# Patient Record
Sex: Male | Born: 1985 | Race: Black or African American | Hispanic: No | Marital: Single | Smoking: Current every day smoker
Health system: Southern US, Community
[De-identification: ages and names within clinical notes are randomized; demographics above are authoritative.]

---

## 2008-07-10 ENCOUNTER — Emergency Department (HOSPITAL_COMMUNITY): Admission: EM | Admit: 2008-07-10 | Discharge: 2008-07-10 | Payer: Self-pay | Admitting: Emergency Medicine

## 2021-01-13 ENCOUNTER — Emergency Department (HOSPITAL_BASED_OUTPATIENT_CLINIC_OR_DEPARTMENT_OTHER): Payer: 59

## 2021-01-13 ENCOUNTER — Emergency Department (HOSPITAL_BASED_OUTPATIENT_CLINIC_OR_DEPARTMENT_OTHER)
Admission: EM | Admit: 2021-01-13 | Discharge: 2021-01-13 | Disposition: A | Discharge status: Home / Self Care | Payer: 59 | Attending: Emergency Medicine | Admitting: Emergency Medicine

## 2021-01-13 ENCOUNTER — Encounter (HOSPITAL_BASED_OUTPATIENT_CLINIC_OR_DEPARTMENT_OTHER): Payer: Self-pay | Admitting: Emergency Medicine

## 2021-01-13 DIAGNOSIS — R059 Cough, unspecified: Secondary | ICD-10-CM | POA: Insufficient documentation

## 2021-01-13 DIAGNOSIS — F17210 Nicotine dependence, cigarettes, uncomplicated: Secondary | ICD-10-CM | POA: Diagnosis not present

## 2021-01-13 DIAGNOSIS — R1110 Vomiting, unspecified: Secondary | ICD-10-CM | POA: Insufficient documentation

## 2021-01-13 DIAGNOSIS — Z20822 Contact with and (suspected) exposure to covid-19: Secondary | ICD-10-CM | POA: Insufficient documentation

## 2021-01-13 DIAGNOSIS — R519 Headache, unspecified: Secondary | ICD-10-CM | POA: Diagnosis not present

## 2021-01-13 DIAGNOSIS — R590 Localized enlarged lymph nodes: Secondary | ICD-10-CM | POA: Diagnosis present

## 2021-01-13 LAB — RESP PANEL BY RT-PCR (FLU A&B, COVID) ARPGX2
Influenza A by PCR: NEGATIVE
Influenza B by PCR: NEGATIVE
SARS Coronavirus 2 by RT PCR: NEGATIVE

## 2021-01-13 LAB — CBC WITH DIFFERENTIAL/PLATELET
Abs Immature Granulocytes: 0.01 10*3/uL (ref 0.00–0.07)
Basophils Absolute: 0 10*3/uL (ref 0.0–0.1)
Basophils Relative: 0 %
Eosinophils Absolute: 0 10*3/uL (ref 0.0–0.5)
Eosinophils Relative: 0 %
HCT: 42.5 % (ref 39.0–52.0)
Hemoglobin: 14.6 g/dL (ref 13.0–17.0)
Immature Granulocytes: 0 %
Lymphocytes Relative: 20 %
Lymphs Abs: 1.2 10*3/uL (ref 0.7–4.0)
MCH: 31.5 pg (ref 26.0–34.0)
MCHC: 34.4 g/dL (ref 30.0–36.0)
MCV: 91.6 fL (ref 80.0–100.0)
Monocytes Absolute: 0.6 10*3/uL (ref 0.1–1.0)
Monocytes Relative: 10 %
Neutro Abs: 4.2 10*3/uL (ref 1.7–7.7)
Neutrophils Relative %: 70 %
Platelets: 207 10*3/uL (ref 150–400)
RBC: 4.64 MIL/uL (ref 4.22–5.81)
RDW: 12.4 % (ref 11.5–15.5)
WBC: 6 10*3/uL (ref 4.0–10.5)
nRBC: 0 % (ref 0.0–0.2)

## 2021-01-13 LAB — COMPREHENSIVE METABOLIC PANEL
ALT: 32 U/L (ref 0–44)
AST: 39 U/L (ref 15–41)
Albumin: 4 g/dL (ref 3.5–5.0)
Alkaline Phosphatase: 72 U/L (ref 38–126)
Anion gap: 9 (ref 5–15)
BUN: 10 mg/dL (ref 6–20)
CO2: 24 mmol/L (ref 22–32)
Calcium: 8.8 mg/dL — ABNORMAL LOW (ref 8.9–10.3)
Chloride: 100 mmol/L (ref 98–111)
Creatinine, Ser: 1.01 mg/dL (ref 0.61–1.24)
GFR, Estimated: >60 mL/min (ref >60)
Glucose, Bld: 101 mg/dL — ABNORMAL HIGH (ref 70–99)
Potassium: 4 mmol/L (ref 3.5–5.1)
Sodium: 133 mmol/L — ABNORMAL LOW (ref 135–145)
Total Bilirubin: 0.8 mg/dL (ref 0.3–1.2)
Total Protein: 8 g/dL (ref 6.5–8.1)

## 2021-01-13 LAB — MONONUCLEOSIS SCREEN: Mono Screen: NEGATIVE

## 2021-01-13 MED ORDER — IOHEXOL 300 MG/ML  SOLN
75.0000 mL | Freq: Once | INTRAMUSCULAR | Status: AC | PRN
  Administered 2021-01-13: 75 mL via INTRAVENOUS

## 2021-01-13 MED ORDER — ACETAMINOPHEN 500 MG PO TABS
1000.0000 mg | ORAL_TABLET | Freq: Once | ORAL | Status: AC
  Administered 2021-01-13: 1000 mg via ORAL
  Filled 2021-01-13: qty 2

## 2021-01-13 MED ORDER — AMOXICILLIN-POT CLAVULANATE 875-125 MG PO TABS: 1.0000 | ORAL_TABLET | Freq: Two times a day (BID) | ORAL | 0 refills | Status: AC

## 2021-01-13 MED ORDER — AMOXICILLIN-POT CLAVULANATE 875-125 MG PO TABS
1.0000 | ORAL_TABLET | Freq: Once | ORAL | Status: AC
  Administered 2021-01-13: 1 via ORAL
  Filled 2021-01-13: qty 1

## 2021-01-13 MED ORDER — ONDANSETRON 4 MG PO TBDP
4.0000 mg | ORAL_TABLET | Freq: Once | ORAL | Status: AC
Start: 2021-01-13 — End: 2021-01-13
  Administered 2021-01-13: 4 mg via ORAL
  Filled 2021-01-13: qty 1

## 2021-01-13 MED ORDER — SODIUM CHLORIDE 0.9 % IV BOLUS
1000.0000 mL | Freq: Once | INTRAVENOUS | Status: AC
  Administered 2021-01-13: 1000 mL via INTRAVENOUS

## 2021-01-13 MED ORDER — ACETAMINOPHEN 325 MG PO TABS
650.0000 mg | ORAL_TABLET | Freq: Once | ORAL | Status: DC
  Filled 2021-01-13: qty 2

## 2021-01-13 MED ORDER — ONDANSETRON HCL 4 MG/2ML IJ SOLN
4.0000 mg | Freq: Once | INTRAMUSCULAR | Status: AC
  Administered 2021-01-13: 4 mg via INTRAVENOUS
  Filled 2021-01-13: qty 2

## 2021-01-13 NOTE — ED Triage Notes (Signed)
Pt reports lymph nodes to RT side neck swollen intermittently x 2wks; denies pain; reports vomiting since yesterday; HA x 1 wk

## 2021-01-13 NOTE — ED Provider Notes (Signed)
[facility] EMERGENCY DEPARTMENT Provider Note   CSN: [id number] Arrival date & time: 01/13/21  1407     History Chief Complaint  Patient presents with   Emesis   Swollen Lymph Nodes    Headache    Paul Obrien is a 35 y.o. male here with R neck swelling, vomiting. He noticed right neck swelling for the last week. Patient states that it got progressively worse. Denies any trouble breathing or trouble swallowing. He started having headaches as well. Had fever for the last 2-3 days. He states that since yesterday, he has been unable to keep anything down and has been vomiting. Denies any abdominal pain. Has nonproductive cough   The history is provided by the patient.      History reviewed. No pertinent past medical history.  There are no problems to display for this patient.   History reviewed. No pertinent surgical history.     No family history on file.  Social History   Tobacco Use   Smoking status: Every Day    Types: Cigarettes   Smokeless tobacco: Never  Substance Use Topics   Alcohol use: Not Currently   Drug use: Not Currently    Home Medications Prior to Admission medications   Not on File    Allergies    Patient has no known allergies.  Review of Systems   Review of Systems  HENT:         Neck swelling   Respiratory:  Positive for cough.   Gastrointestinal:  Positive for vomiting.  Neurological:  Positive for headaches.  All other systems reviewed and are negative.  Physical Exam Updated Vital Signs BP (!) 122/56 (BP Location: Right Arm)   Pulse 81   Temp 99.4 F (37.4 C) (Oral)   Resp 18   Ht 6\' 1"  (1.854 m)   Wt (!) 171.9 kg   SpO2 98%   BMI 50.00 kg/m   Physical Exam Vitals and nursing note reviewed.  Constitutional:      Comments: Slightly uncomfortable   HENT:     Head: Normocephalic.     Mouth/Throat:     Mouth: Mucous membranes are moist.     Comments: Posterior pharynx clear  Eyes:     Extraocular  Movements: Extraocular movements intact.     Pupils: Pupils are equal, round, and reactive to light.  Neck:     Comments: R side of the neck swollen with tender LAD  Cardiovascular:     Rate and Rhythm: Normal rate and regular rhythm.     Heart sounds: Normal heart sounds.  Pulmonary:     Effort: Pulmonary effort is normal.     Breath sounds: Normal breath sounds.  Abdominal:     General: Bowel sounds are normal.     Palpations: Abdomen is soft.  Musculoskeletal:        General: Normal range of motion.  Skin:    General: Skin is warm.  Neurological:     Mental Status: He is alert and oriented to person, place, and time.  Psychiatric:        Mood and Affect: Mood normal.        Behavior: Behavior normal.    ED Results / Procedures / Treatments   Labs (all labs ordered are listed, but only abnormal results are displayed) Labs Reviewed  COMPREHENSIVE METABOLIC PANEL - Abnormal; Notable for the following components:      Result Value   Sodium 133 (*)    Glucose,  Bld 101 (*)    Calcium 8.8 (*)    All other components within normal limits  RESP PANEL BY RT-PCR (FLU A&B, COVID) ARPGX2  CBC WITH DIFFERENTIAL/PLATELET  MONONUCLEOSIS SCREEN    EKG None  Radiology CT Soft Tissue Neck W Contrast  Result Date: 01/13/2021 CLINICAL DATA:  Neck abscess, deep tissue. Additional history provided: Patient reports lymph nodes in right side of neck swollen intermittently for 2 weeks, patient reports vomiting since yesterday, headache for 1 week. EXAM: CT NECK WITH CONTRAST TECHNIQUE: Multidetector CT imaging of the neck was performed using the standard protocol following the bolus administration of intravenous contrast. CONTRAST:  33mL OMNIPAQUE IOHEXOL 300 MG/ML  SOLN COMPARISON:  No pertinent prior exams available for comparison. FINDINGS: Pharynx and larynx: No appreciable swelling or discrete mass within the oral cavity, pharynx or larynx. The epiglottis is unremarkable. No  retropharyngeal collection. Salivary glands: No inflammation, mass, or stone. Thyroid: Unremarkable. Lymph nodes: There is somewhat prominent cervical lymphadenopathy at the right level 2, 3 and 5 stations. Index right level 2 lymph nodes measures up to 17 mm in short axis (series 4, image 83) (series 4, image 69). Fat stranding/edema surrounds these enlarged lymph nodes. Vascular: The major vascular structures of the neck are patent. The right internal jugular vein is significantly narrowed within the upper and mid right neck secondary to mass effect from adjacent lymphadenopathy. Limited intracranial: No evidence of acute intracranial abnormality within the field of view. Visualized orbits: Incompletely imaged. No orbital mass or acute orbital finding at the imaged levels. Mastoids and visualized paranasal sinuses: No significant paranasal sinus disease or mastoid effusion at the imaged levels. Skeleton: Straightening of the expected cervical lordosis. No acute bony abnormality or aggressive osseous lesion. Upper chest: No consolidation within the imaged lung apices. IMPRESSION: Somewhat prominent lymphadenopathy within the right neck at the level 2, 3 and 5 stations with surrounding fat stranding/edema. Index right level 2 lymph nodes measures 17 mm in short axis. This lymphadenopathy may be infectious/reactive in etiology. However, nodal metastatic disease or a lymphoproliferative process (such as lymphoma) are also considerations. A minimum of close clinical follow-up (with imaging follow-up as warranted) is recommended. If this lymphadenopathy persists, direct tissue sampling should be considered. Significant narrowing of the right internal jugular vein at the level of the upper/mid neck due to mass effect from adjacent lymphadenopathy. Electronically Signed   By: Jackey Loge D.O.   On: 01/13/2021 17:41   DG Chest Port 1 View  Result Date: 01/13/2021 CLINICAL DATA:  Cough, fever EXAM: PORTABLE CHEST 1  VIEW COMPARISON:  None. FINDINGS: The heart size and mediastinal contours are within normal limits. Both lungs are clear. The visualized skeletal structures are unremarkable. IMPRESSION: No active disease. Electronically Signed   By: Charlett Nose M.D.   On: 01/13/2021 16:20    Procedures Procedures   Medications Ordered in ED Medications  acetaminophen (TYLENOL) tablet 650 mg (0 mg Oral Hold 01/13/21 1633)  amoxicillin-clavulanate (AUGMENTIN) 875-125 MG per tablet 1 tablet (has no administration in time range)  ondansetron (ZOFRAN-ODT) disintegrating tablet 4 mg (4 mg Oral Given 01/13/21 1502)  acetaminophen (TYLENOL) tablet 1,000 mg (1,000 mg Oral Given 01/13/21 1522)  ondansetron (ZOFRAN) injection 4 mg (4 mg Intravenous Given 01/13/21 1620)  sodium chloride 0.9 % bolus 1,000 mL (1,000 mLs Intravenous New Bag/Given 01/13/21 1621)  iohexol (OMNIPAQUE) 300 MG/ML solution 75 mL (75 mLs Intravenous Contrast Given 01/13/21 1704)    ED Course  I have reviewed the  triage vital signs and the nursing notes.  Pertinent labs & imaging results that were available during my care of the patient were reviewed by me and considered in my medical decision making (see chart for details).    MDM Rules/Calculators/A&P                           Paul Obrien is a 35 y.o. male here with R neck swelling, vomiting. Consider sialoadenitis vs neck abscess vs mono vs other viral etiologies. Posterior pharynx clear with no signs of strep or RPA. Will get cbc, cmp, mono, CXR, CT neck. Will hydrate and reassess.    6:22 PM Mono negative. WBC normal.  Chemistry unremarkable.  CT showed enlarged lymph nodes on the right side.  There was concern for possible infectious etiology versus mass.  Patient's temperature is normal now.  At this point we will treat with a course of Augmentin.  Will refer to ENT for possible biopsy if it is persistently swollen  Final Clinical Impression(s) / ED Diagnoses Final diagnoses:   None    Rx / DC Orders ED Discharge Orders     None        Charlynne Pander, MD 01/13/21 [phone]

## 2021-01-13 NOTE — Discharge Instructions (Addendum)
You have a swollen lymph node on the right side.  This could be from infection or from a mass  Take Augmentin twice daily for a week.  If it still swollen in a week, please call ENT for follow-up.  You likely will need a biopsy to make sure you do not have cancer  Return to ER if you have worse neck swelling, trouble swallowing, trouble breathing

## 2023-04-18 IMAGING — DX DG CHEST 1V PORT
1 series · 1 of 1 positions shown · non-contrast
Comparison: None.

CLINICAL DATA: Cough, fever

EXAM:
PORTABLE CHEST 1 VIEW

[chest ap]
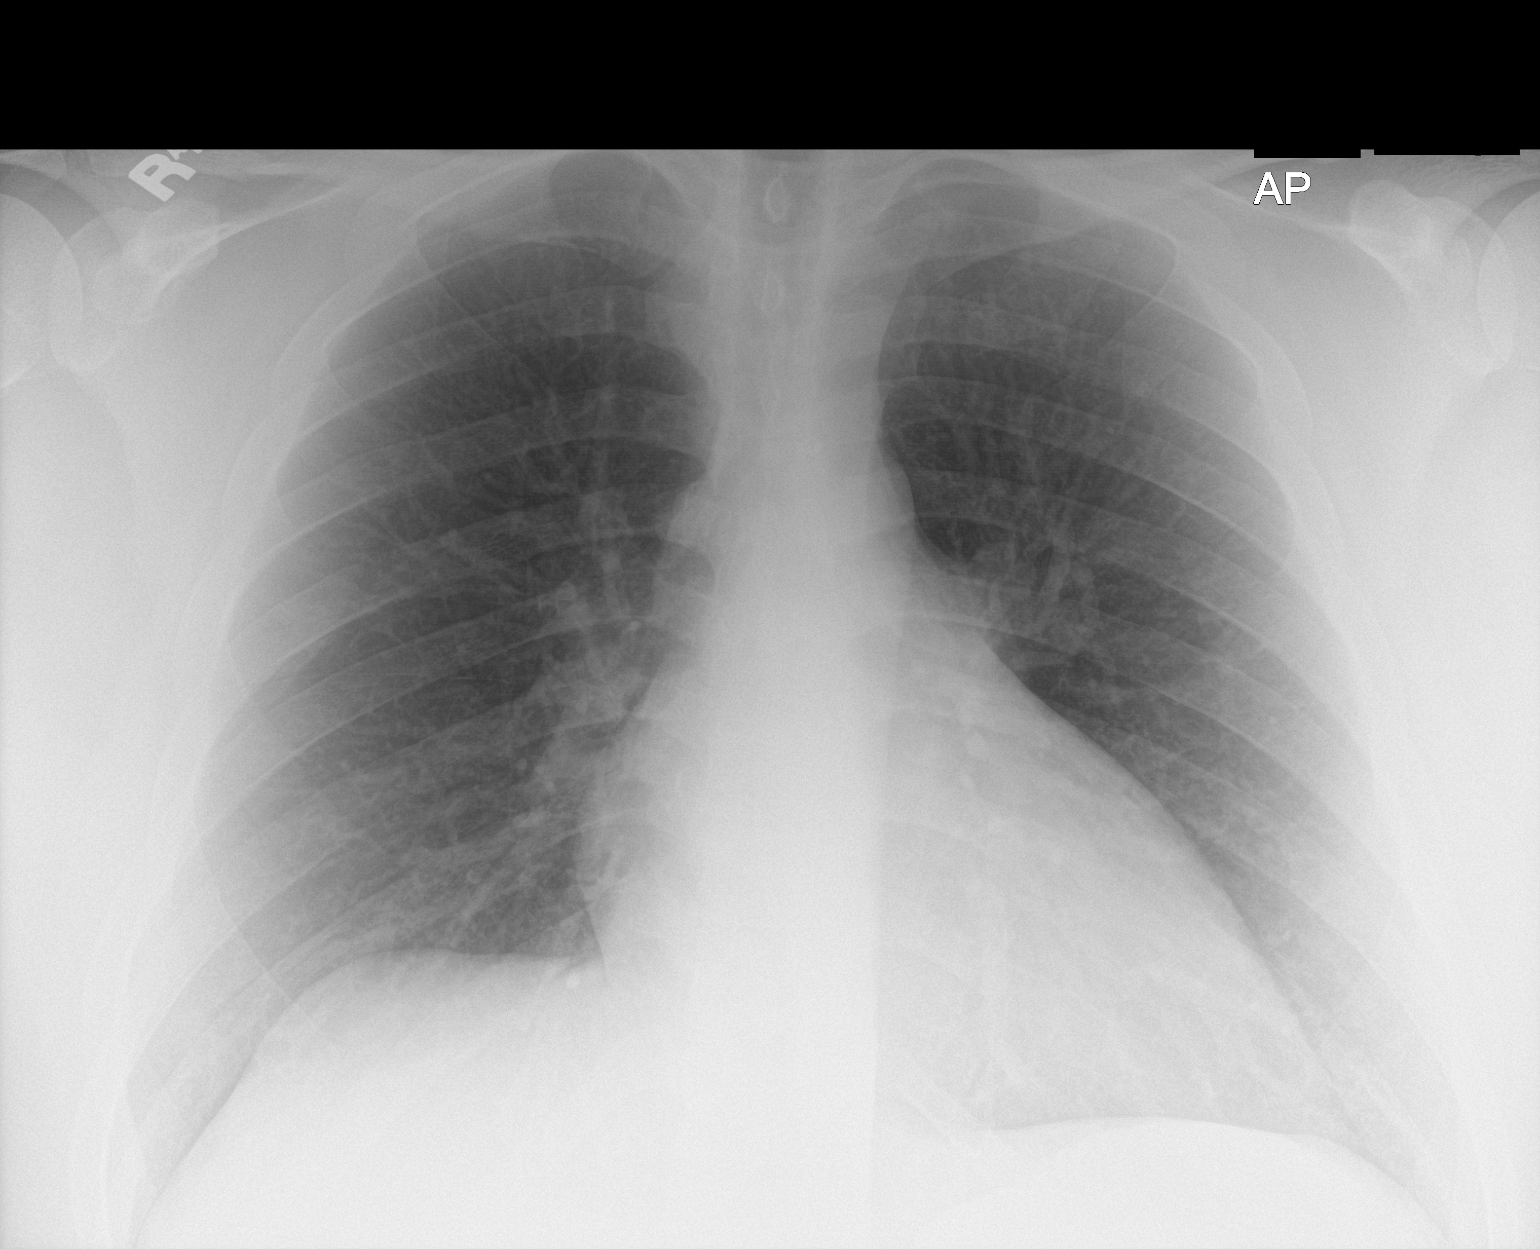

[1 of 1 positions shown; findings below may reference images not displayed]

FINDINGS: The heart size and mediastinal contours are within normal limits.
Both lungs are clear. The visualized skeletal structures are
unremarkable.
IMPRESSION: No active disease.

## 2023-04-18 IMAGING — CT CT NECK W/ CM
3 of 4 series · 13 of 33 positions shown, 16 images · IV contrast (omnipaque)
Comparison: No pertinent prior exams available for comparison.

CLINICAL DATA: Neck abscess, deep tissue. Additional history
provided: Patient reports lymph nodes in right side of neck swollen
intermittently for 2 weeks, patient reports vomiting since
yesterday, headache for 1 week.

EXAM:
CT NECK WITH CONTRAST
TECHNIQUE: Multidetector CT imaging of the neck was performed using the
standard protocol following the bolus administration of intravenous
contrast.
CONTRAST:  75mL OMNIPAQUE IOHEXOL 300 MG/ML  SOLN

[Series 2: axial neck · axial · 0.66mm/px · z∈[-538,-380]mm · 5 of 119 slices shown, 7 images]
[im 20/119  soft-tissue]
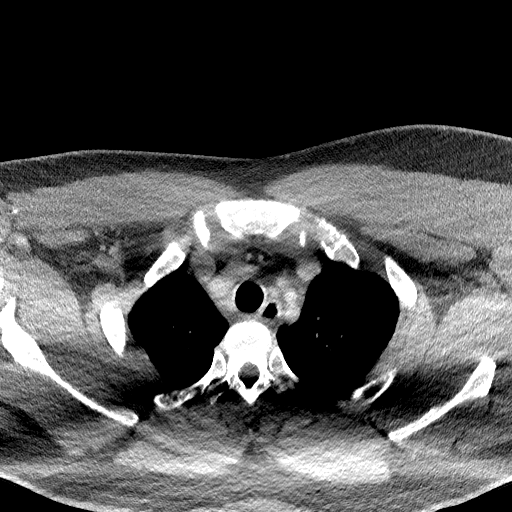
[im 20/119  bone]
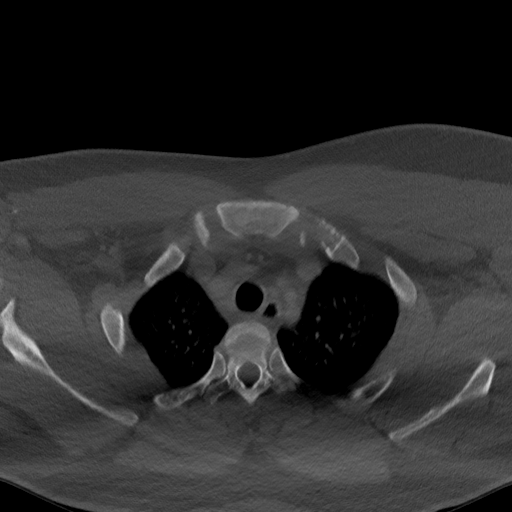
[im 40/119  bone]
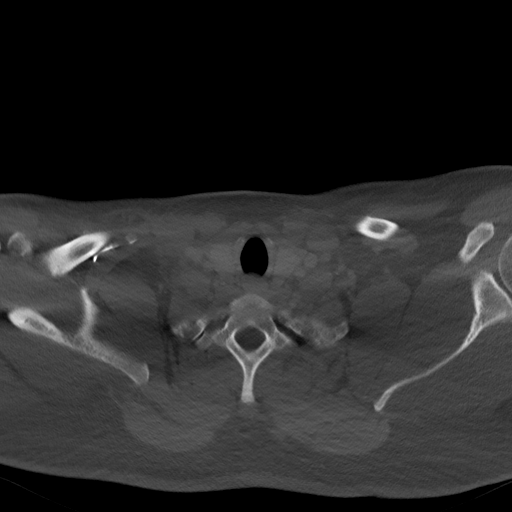
[im 60/119  bone]
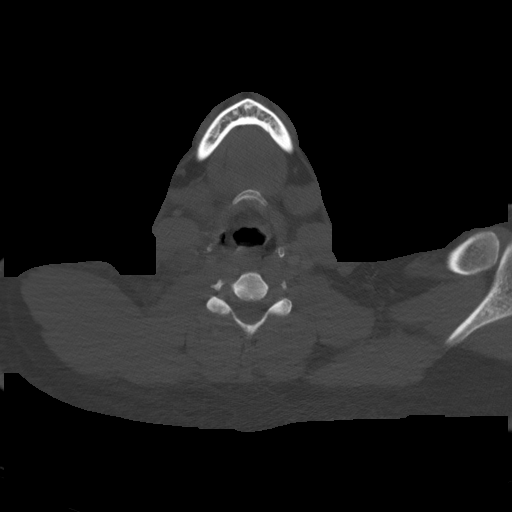
[im 79/119  bone]
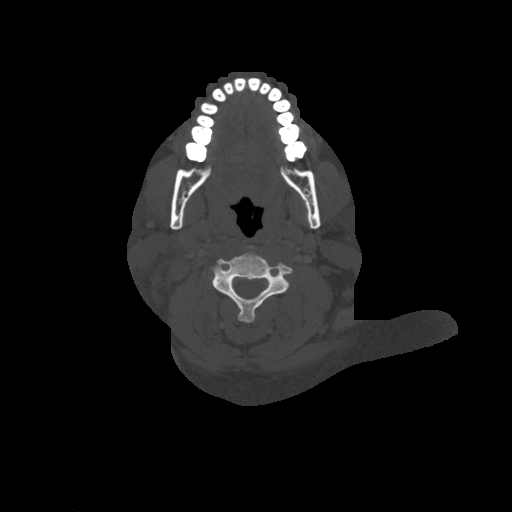
[im 99/119  soft-tissue]
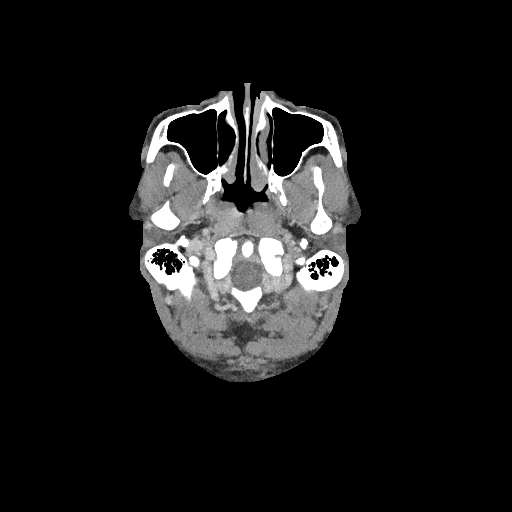
[im 99/119  bone]
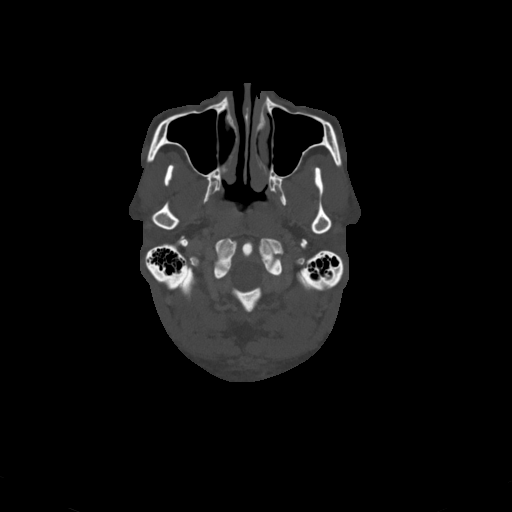

[Series 3: sag neck · sagittal · 0.48mm/px · 5 of 129 slices shown, 6 images]
[im 43/129  bone]
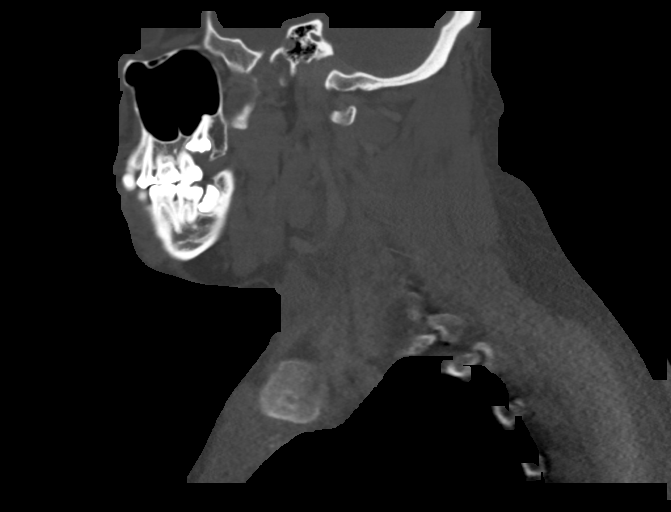
[im 54/129  bone]
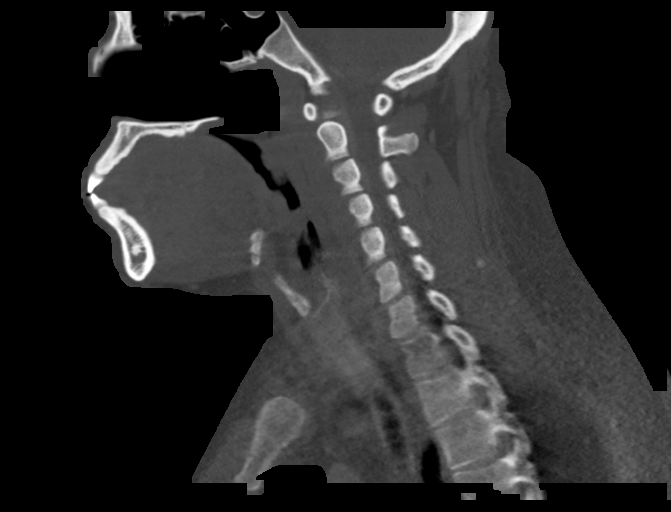
[im 65/129  soft-tissue]
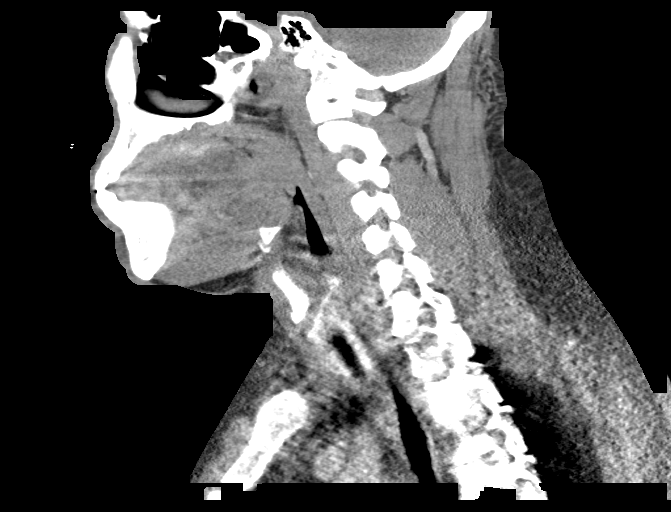
[im 65/129  bone]
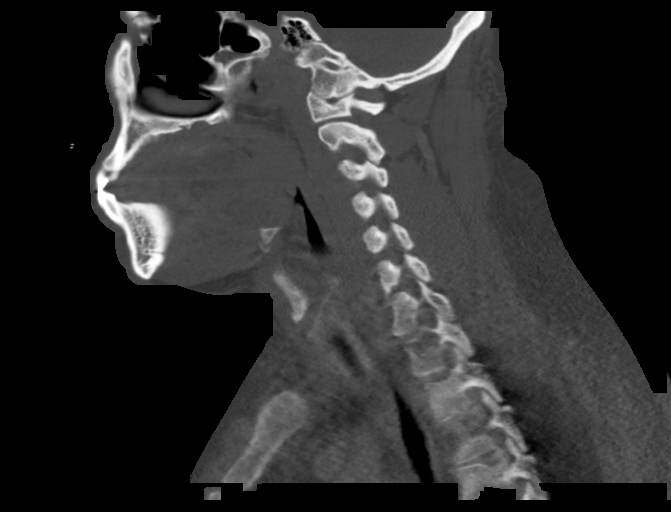
[im 75/129  bone]
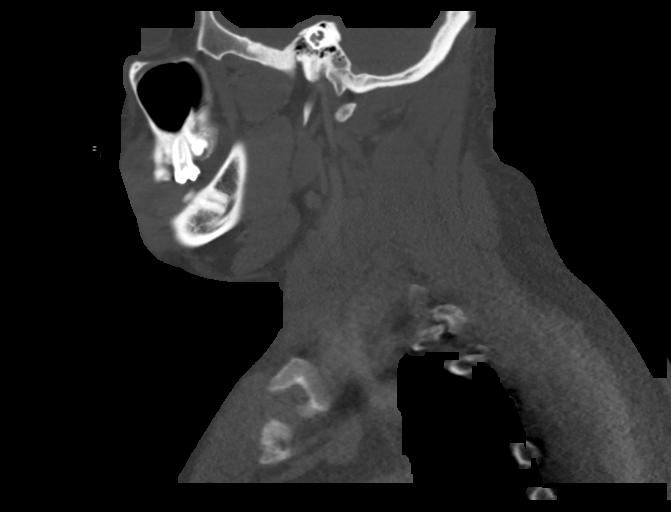
[im 86/129  bone]
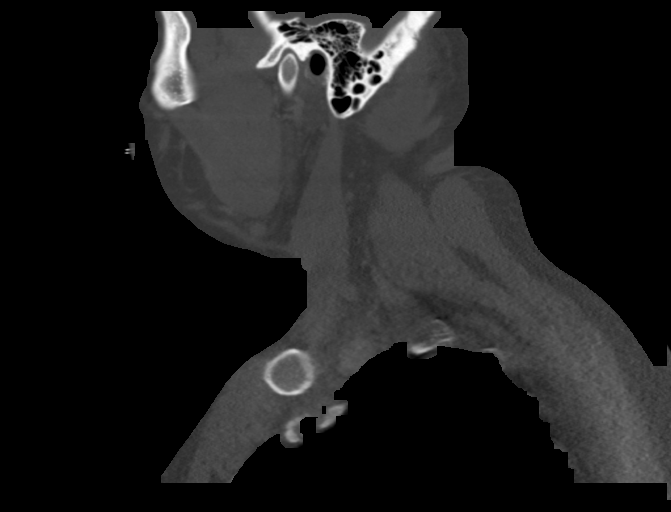

[Series 4: cor neck · coronal · 0.48mm/px · 3 of 143 slices shown]
[im 29/143  bone]
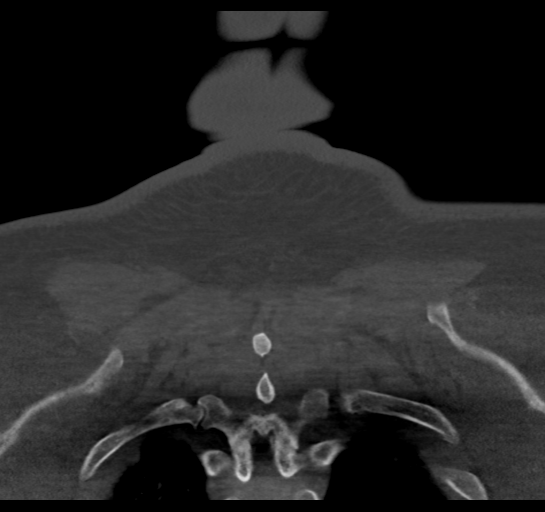
[im 57/143  bone]
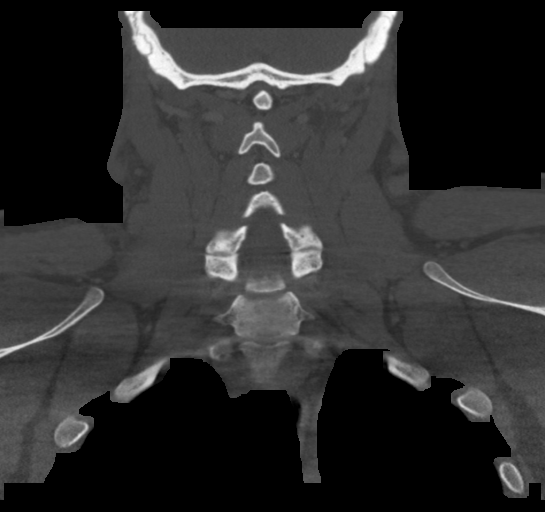
[im 86/143  bone]
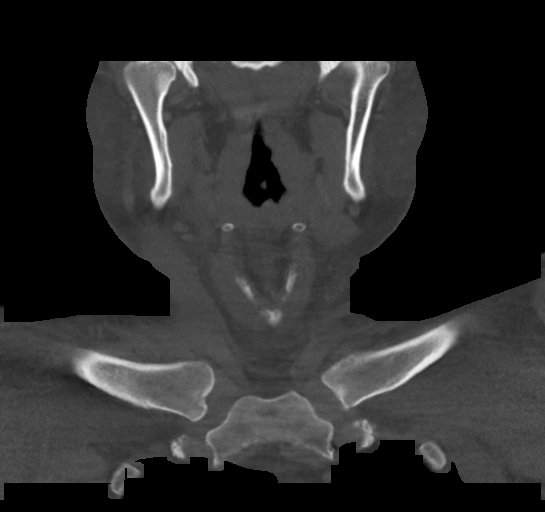

[13 of 33 positions shown; findings below may reference images not displayed]

FINDINGS: Pharynx and larynx: No appreciable swelling or discrete mass within
the oral cavity, pharynx or larynx. The epiglottis is unremarkable.
No retropharyngeal collection.

Salivary glands: No inflammation, mass, or stone.

Thyroid: Unremarkable.

Lymph nodes: There is somewhat prominent cervical lymphadenopathy at
the right level 2, 3 and 5 stations. Index right level 2 lymph nodes
measures up to 17 mm in short axis (series 4, image 83) (series 4,
image 69). Fat stranding/edema surrounds these enlarged lymph nodes.

Vascular: The major vascular structures of the neck are patent. The
right internal jugular vein is significantly narrowed within the
upper and mid right neck secondary to mass effect from adjacent
lymphadenopathy.

Limited intracranial: No evidence of acute intracranial abnormality
within the field of view.

Visualized orbits: Incompletely imaged. No orbital mass or acute
orbital finding at the imaged levels.

Mastoids and visualized paranasal sinuses: No significant paranasal
sinus disease or mastoid effusion at the imaged levels.

Skeleton: Straightening of the expected cervical lordosis. No acute
bony abnormality or aggressive osseous lesion.

Upper chest: No consolidation within the imaged lung apices.
IMPRESSION: Somewhat prominent lymphadenopathy within the right neck at the
level 2, 3 and 5 stations with surrounding fat stranding/edema.
Index right level 2 lymph nodes measures 17 mm in short axis. This
lymphadenopathy may be infectious/reactive in etiology. However,
nodal metastatic disease or a lymphoproliferative process (such as
lymphoma) are also considerations. A minimum of close clinical
follow-up (with imaging follow-up as warranted) is recommended. If
this lymphadenopathy persists, direct tissue sampling should be
considered.

Significant narrowing of the right internal jugular vein at the
level of the upper/mid neck due to mass effect from adjacent
lymphadenopathy.
